# Patient Record
Sex: Female | Born: 1960 | Race: White | Hispanic: No | Marital: Married | Smoking: Never smoker
Health system: Southern US, Community
[De-identification: ages and names within clinical notes are randomized; demographics above are authoritative.]

## PROBLEM LIST (undated history)

## (undated) DIAGNOSIS — I10 Essential (primary) hypertension: Secondary | ICD-10-CM

## (undated) DIAGNOSIS — I2510 Atherosclerotic heart disease of native coronary artery without angina pectoris: Secondary | ICD-10-CM

## (undated) DIAGNOSIS — E785 Hyperlipidemia, unspecified: Secondary | ICD-10-CM

## (undated) HISTORY — PX: NEUROMA SURGERY: SHX722

## (undated) HISTORY — DX: Hyperlipidemia, unspecified: E78.5

## (undated) HISTORY — PX: ABDOMINAL HYSTERECTOMY: SHX81

## (undated) HISTORY — PX: CORONARY ARTERY BYPASS GRAFT: SHX141

## (undated) HISTORY — PX: CARDIAC CATHETERIZATION: SHX172

## (undated) HISTORY — DX: Atherosclerotic heart disease of native coronary artery without angina pectoris: I25.10

## (undated) HISTORY — DX: Essential (primary) hypertension: I10

---

## 2013-01-24 HISTORY — PX: OTHER SURGICAL HISTORY: SHX169

## 2013-02-04 ENCOUNTER — Observation Stay: Payer: Self-pay | Admitting: Internal Medicine

## 2013-02-04 LAB — TROPONIN I: Troponin-I: 0.1 ng/mL — ABNORMAL HIGH

## 2013-02-04 LAB — CK TOTAL AND CKMB (NOT AT ARMC): CK, Total: 50 U/L (ref 21–215)

## 2013-02-04 LAB — CBC
HCT: 41 % (ref 35.0–47.0)
MCHC: 33.8 g/dL (ref 32.0–36.0)
MCV: 85 fL (ref 80–100)
RDW: 12.9 % (ref 11.5–14.5)
WBC: 8.1 10*3/uL (ref 3.6–11.0)

## 2013-02-04 LAB — BASIC METABOLIC PANEL
Calcium, Total: 8.9 mg/dL (ref 8.5–10.1)
Chloride: 114 mmol/L — ABNORMAL HIGH (ref 98–107)
Co2: 22 mmol/L (ref 21–32)
EGFR (African American): >60
Glucose: 110 mg/dL — ABNORMAL HIGH (ref 65–99)
Sodium: 142 mmol/L (ref 136–145)

## 2013-02-05 LAB — BASIC METABOLIC PANEL
BUN: 12 mg/dL (ref 7–18)
Calcium, Total: 8.5 mg/dL (ref 8.5–10.1)
Chloride: 110 mmol/L — ABNORMAL HIGH (ref 98–107)
Co2: 26 mmol/L (ref 21–32)
Creatinine: 0.64 mg/dL (ref 0.60–1.30)
EGFR (African American): >60
EGFR (Non-African Amer.): >60
Sodium: 142 mmol/L (ref 136–145)

## 2013-02-05 LAB — LIPID PANEL: Cholesterol: 182 mg/dL (ref 0–200)

## 2013-02-05 LAB — CBC WITH DIFFERENTIAL/PLATELET
Basophil %: 0.5 %
Eosinophil #: 0.2 10*3/uL (ref 0.0–0.7)
HCT: 40.3 % (ref 35.0–47.0)
HGB: 13.7 g/dL (ref 12.0–16.0)
MCH: 28.8 pg (ref 26.0–34.0)
MCV: 85 fL (ref 80–100)
Monocyte #: 0.5 x10 3/mm (ref 0.2–0.9)
Monocyte %: 6.2 %
Neutrophil #: 4 10*3/uL (ref 1.4–6.5)
Neutrophil %: 49.7 %
RDW: 12.7 % (ref 11.5–14.5)

## 2014-06-17 IMAGING — US ABDOMEN ULTRASOUND LIMITED
1 series · 14 of 25 positions shown · non-contrast
Comparison: none

REASON FOR EXAM: RUQ pain and nausea
COMMENTS:   Body Site: GB and Fossa, CBD, Head of Pancreas

PROCEDURE:     US  - US ABDOMEN LIMITED SURVEY  - February 04, 2013  [DATE]
RESULT:     Comparison: None.
TECHNIQUE: Multiple grayscale and color Doppler images were obtained of the
right upper quadrant.

[Series 1: abdomen ultrasound limited · 0.31mm/px · 14 of 45 slices shown]
[im 1/45]
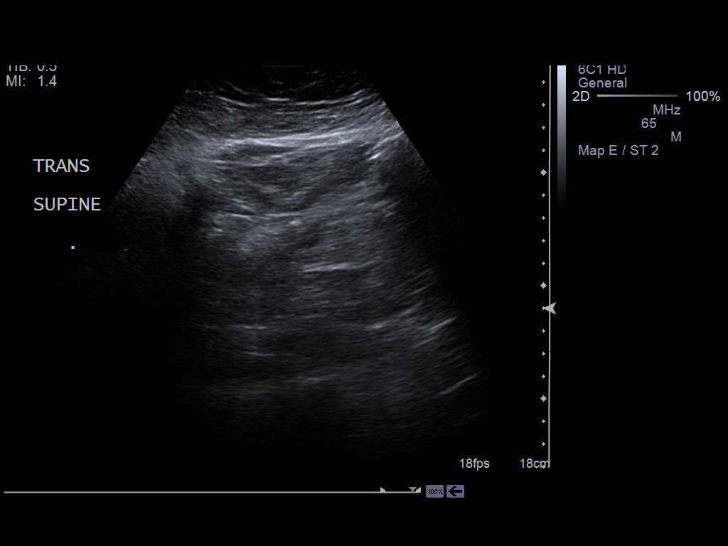
[im 4/45]
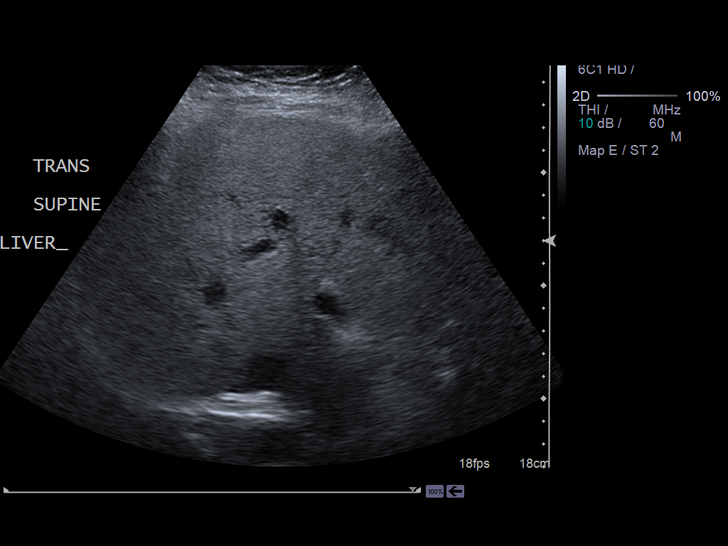
[im 8/45]
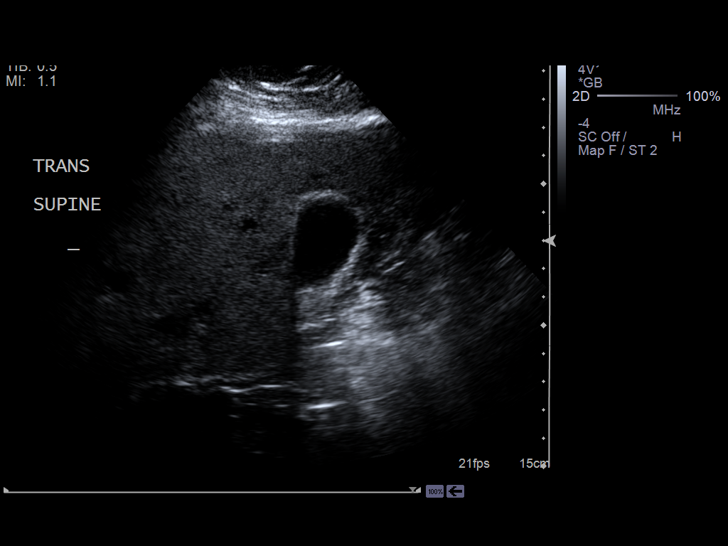
[im 12/45]
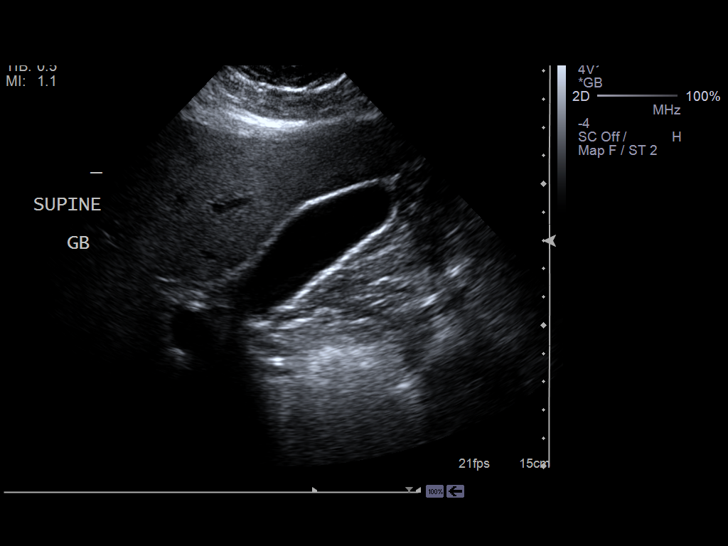
[im 15/45]
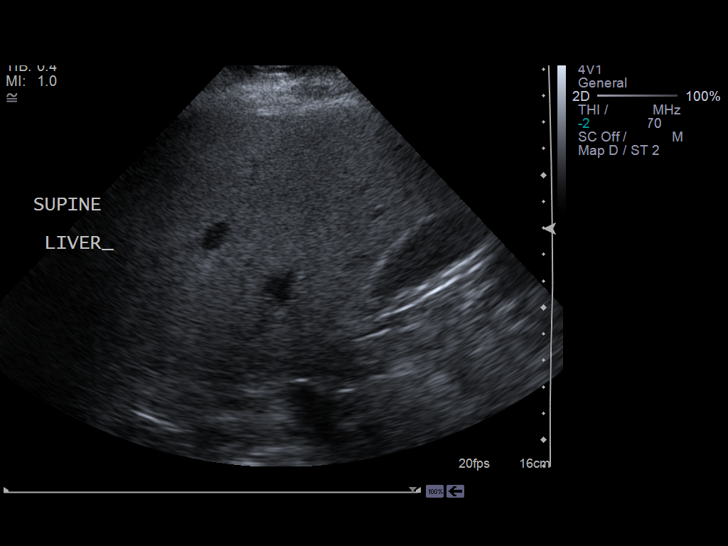
[im 17/45]
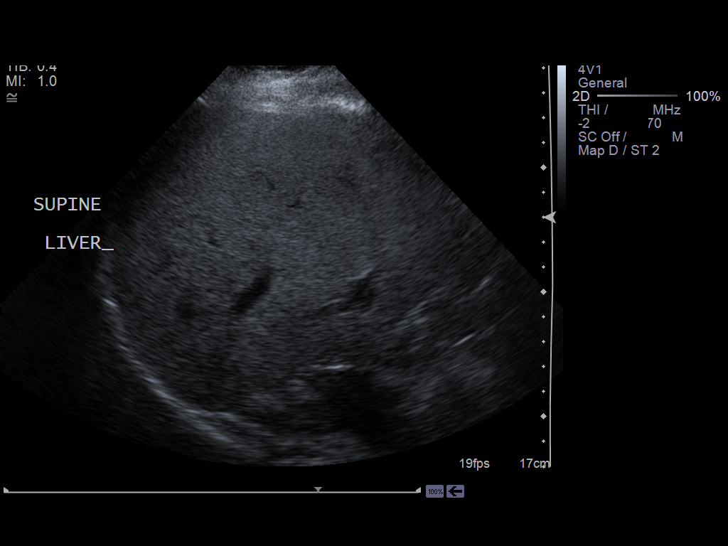
[im 21/45]
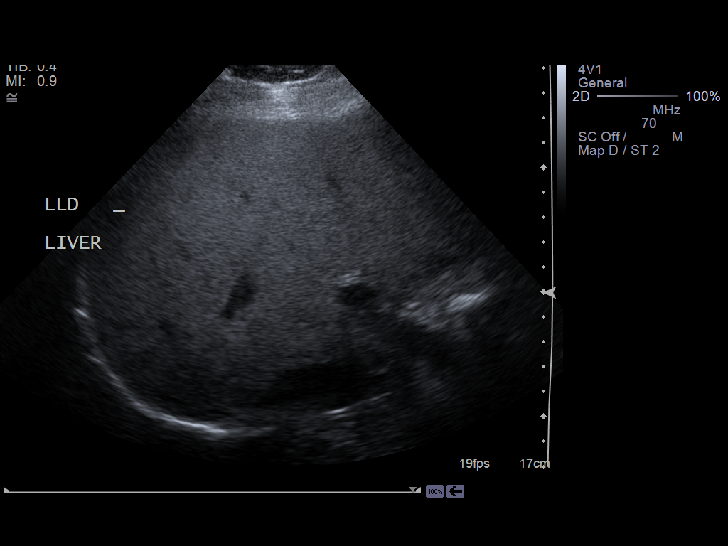
[im 24/45]
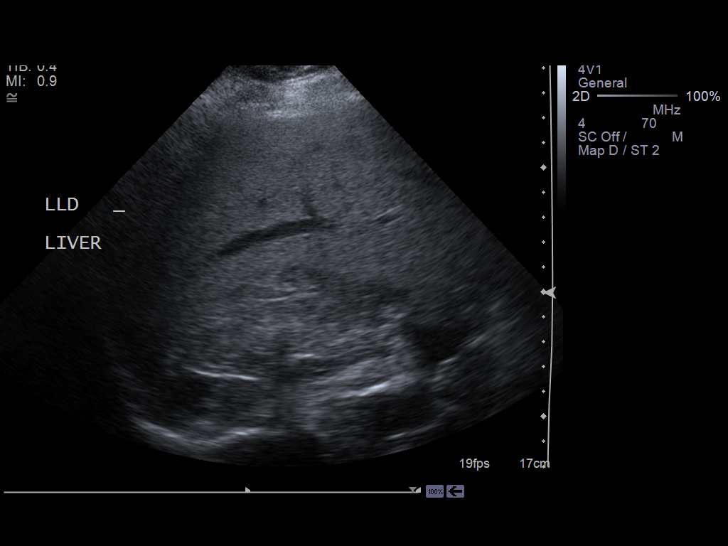
[im 28/45]
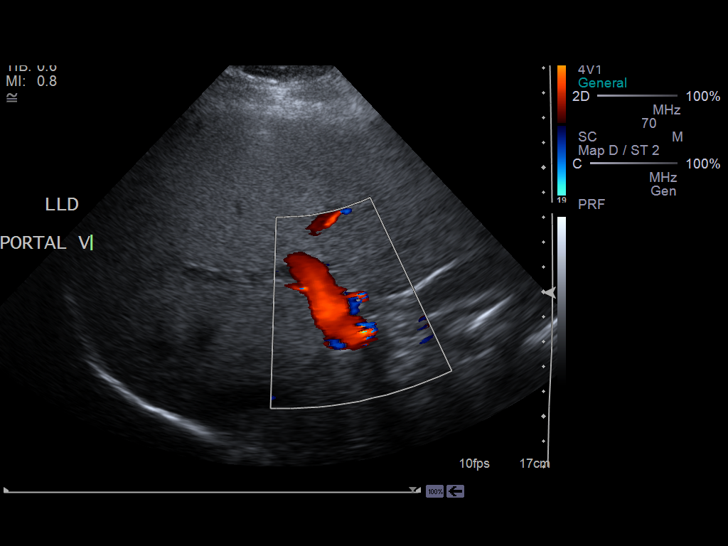
[im 30/45]
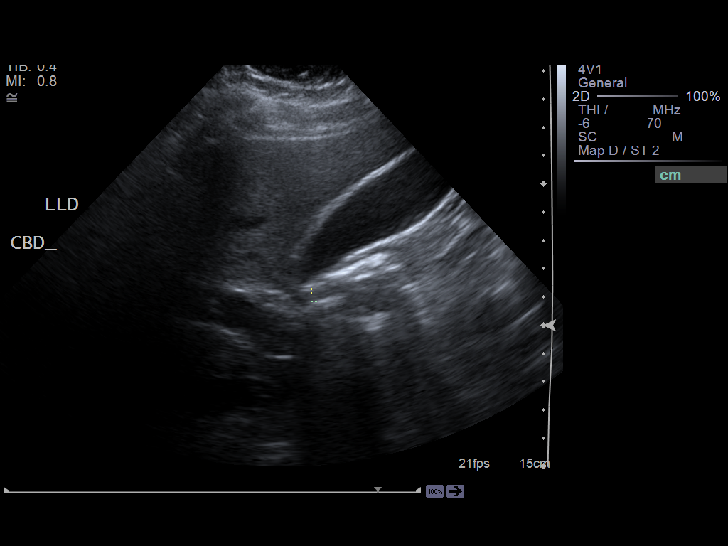
[im 34/45]
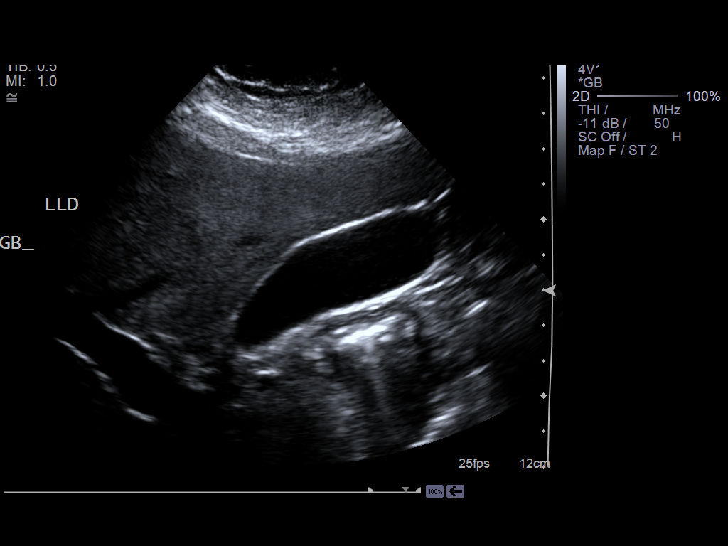
[im 37/45]
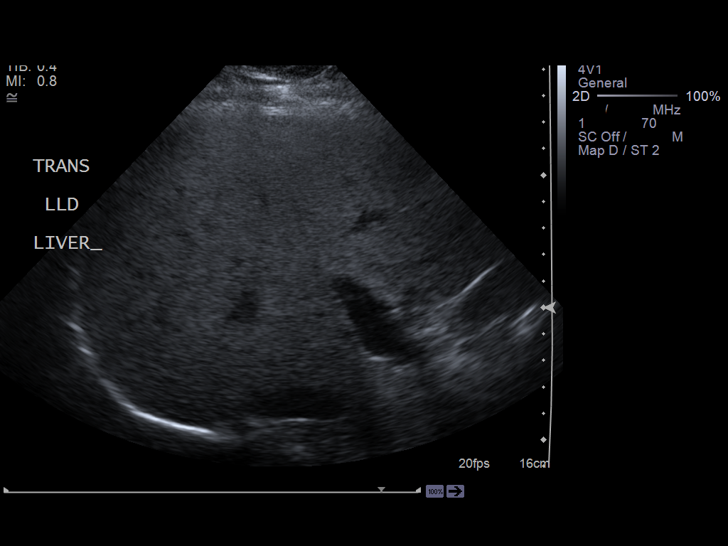
[im 41/45]
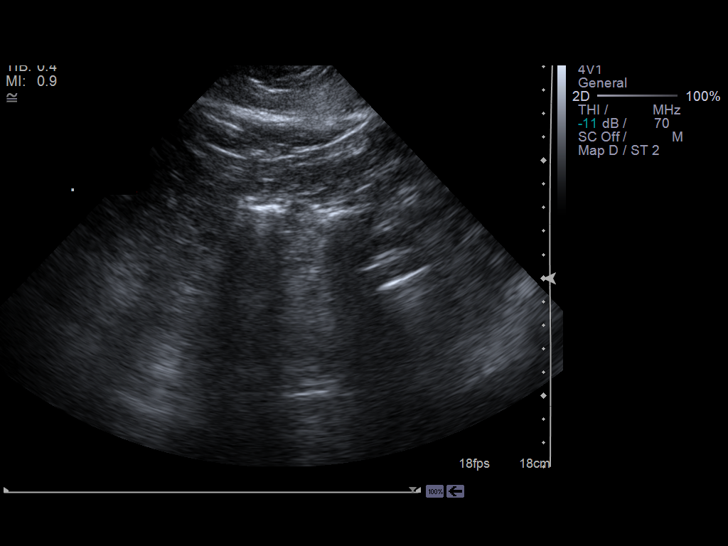
[im 45/45]
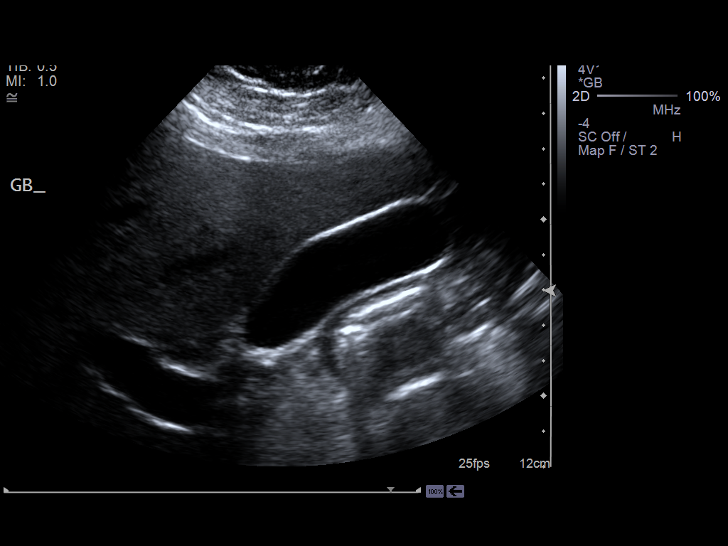

[14 of 25 positions shown; findings below may reference images not displayed]

FINDINGS: The pancreas was obscured by overlying bowel gas. The liver is somewhat
hyperechoic, raising the possibility of hepatic steatosis. The gallbladder
is normal. Sonographic Murphy sign was negative. The common bile duct
measures 4 mm in diameter.
IMPRESSION: 1. Normal gallbladder.
2. The pancreas was obscured.

## 2015-03-18 NOTE — Discharge Summary (Signed)
PATIENT NAME:  Tracie [name], Tracie [name] MR#:  [id number] DATE OF BIRTH:  13-Jul-1961  DATE OF ADMISSION:  02/04/2013 DATE OF DISCHARGE:  02/06/2013  DISCHARGE DIAGNOSES: 1.  Atypical chest/back pain with borderline elevated cardiac enzymes, likely noncardiac, could be musculoskeletal in nature, now resolved. Negative Myoview.  2.  Hyperlipidemia, diet controlled, LDL of 109.   SECONDARY DIAGNOSIS:  Arthritis.   CONSULTANT: Harold HedgeKenneth Fath, MD - Cardiology.  PROCEDURES/RADIOLOGY: Myoview on 14th of March showed negative Myoview, EF of 48%. No evidence of ischemia.   Chest x-ray on 12th of March showed no acute cardiopulmonary disease.   Abdominal ultrasound on 12th of March showed normal gallbladder, obscured pancreas.   2-[name] echocardiogram on 12th of March showed normal LV ejection fraction with a value of 60% to 65%   HISTORY AND SHORT HOSPITAL COURSE: The patient is a 54 year old female with no significant medical problems who was admitted for atypical chest/back pain. Cardiology consultation was obtained with Dr. Lady GaryFath as the patient had borderline elevated cardiac enzymes. He recommended Myoview, which was performed on 14th of March and was negative. She had chest pain and back pain which was resolved and was stable enough to be discharged home. On the 14th of March she was discharged home in stable condition. On the date of discharge, her vital signs were as follows: Temperature 98, heart rate 96 per minute, respirations 18 per minute, blood pressure 124/88 mmHg and she was saturating 97% on room air.   PERTINENT PHYSICAL EXAMINATION ON THE DATE OF DISCHARGE:  CARDIOVASCULAR: S1 and S2 normal. No murmurs, rubs or gallop.  LUNGS: Clear to auscultation bilaterally. No wheezing, rales, rhonchi or crepitation.  ABDOMEN: Soft, benign.  NEUROLOGIC: Nonfocal examination. All other physical examination remained at baseline.   DISCHARGE MEDICATION: Ibuprofen 600 mg p.o. every 8 hours as needed.   DISCHARGE  DIET: Regular.   DISCHARGE ACTIVITY: As tolerated.   DISCHARGE INSTRUCTIONS:  The patient was instructed to follow up with her primary care physician, Ms. Destry Taylor/Dr. Loraine LericheMark [facility]. She was also requested to follow up with cardiology, Dr. Lady GaryFath, in 2 to 4 weeks if needed.   TOTAL TIME DISCHARGING THIS PATIENT: 45 minutes.  ____________________________ Ellamae SiaVipul S. Sherryll BurgerShah, MD vss:sb [name]: 02/10/2013 11:20:01 ET T: 02/10/2013 11:40:45 ET JOB#: [id number]  cc: Cooper Stamp S. Sherryll BurgerShah, MD, <Dictator> [facility] Kenneth A. Lady GaryFath, MD  Patricia PesaVIPUL S Karron Goens MD ELECTRONICALLY SIGNED 02/12/2013 9:40

## 2015-03-18 NOTE — Consult Note (Signed)
Brief Consult Note: Diagnosis: chest pain with borderline troponin elvation.   Patient was seen by consultant.   Recommend further assessment or treatment.   Comments: Pt with history of strong family history of cvad now with chest pian with atypical features. Tropnin borderline elevated. EKG unremarkable. Stong family history of cad. After discussion with patient and husband, will schedule funcitonl study in am. Further recs after myoview. Based on ekg and tro[ponin, does not appear to be a nstemi..  Electronic Signatures: Dalia HeadingFath, Kenneth A (MD)  (Signed 13-Mar-14 16:18)  Authored: Brief Consult Note   Last Updated: 13-Mar-14 16:18 by Dalia HeadingFath, Kenneth A (MD)

## 2015-03-18 NOTE — H&P (Signed)
PATIENT NAME:  Tracie [name], Tracie [name] MR#:  [id number] DATE OF BIRTH:  September 21, 1961  DATE OF ADMISSION:  02/04/2013  PRIMARY CARE PHYSICIAN: Steele Sizer, MD, Family Practice. She follows with the PA there.   CHIEF COMPLAINT: Awoke out of bed out of a dead sleep with pain in the back between shoulder blades, radiating up into the neck/ear area and down to the elbows bilaterally, associated with chest tightness, heaviness in the chest, 9 out of 10 in intensity pain. The pain does come and go. She finds it hard to take a deep breath. Lasting 1 minute at a time. Lying flat sort of exacerbates the pain, and sitting up makes it feel okay. Associated with nausea, but no vomiting. No diarrhea. She did have some chills yesterday and some blurry vision. She received morphine and nausea medication in the ER and feels a little bit better. In the ER, she had an ultrasound of the abdomen that was negative, a chest x-ray that was negative. They did a second troponin, and it was borderline at 0.09, and hospitalist services were contacted for further evaluation.   PAST MEDICAL HISTORY: Arthritis in knees and obesity.   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   ALLERGIES: TO SULFA, WHICH CAUSES A RASH.   MEDICATIONS: Include a multivitamin daily and Osteo Bi-Flex.   SOCIAL HISTORY: No smoking. Rare alcohol. No drug use. Works as a Sales executive.  FAMILY HISTORY: Mother living, 25, has heart disease and end-stage COPD. Father died in his 21s of an MI, had his first MI in his 75s. Siblings are healthy cardiac-wise, do have some psychiatric issues. One does have rubella-related issues  REVIEW OF SYSTEMS:   CONSTITUTIONAL: Positive for chills. No fever, no sweats. Positive for weight loss, 13 pounds, trying to do so.  EYES: She does wear glasses and has some fuzzy vision.  EARS, NOSE, MOUTH AND THROAT: Positive for postnasal drip. Positive for  hoarse voice.  CARDIOVASCULAR: Positive for heaviness in the chest.  RESPIRATORY: Positive for shortness of breath. No coughing. No sputum. No hemoptysis.  GASTROINTESTINAL: Positive for nausea. No vomiting. Slight abdominal pain in the right upper quadrant. No diarrhea. No constipation. No bright red blood per rectum. No melena.  GENITOURINARY: No burning on urination or hematuria.  MUSCULOSKELETAL: Positive for knee pain and chest and back pain today.  INTEGUMENT: No rashes or eruptions.  NEUROLOGICAL: No fainting or blackouts.  PSYCHIATRIC: No anxiety or depression.  ENDOCRINE: No thyroid problems.  HEMATOLOGIC AND LYMPHATIC: No anemia. No easy bruising or bleeding.   PHYSICAL EXAMINATION:  VITAL SIGNS: Temperature 98.6, pulse 70, respirations 20, blood pressure 144/73, pulse oximetry 98% on room air.  GENERAL: No respiratory distress.  EYES: Conjunctivae and lids normal. Pupils equal, round and reactive to light. Extraocular muscles intact. No nystagmus.  EARS, NOSE, MOUTH AND THROAT: Tympanic membranes: No erythema. Nasal mucosa: No erythema. Throat: Slight erythema. No exudate seen. Lips and gums: No lesions.  NECK: No JVD. No bruits. No lymphadenopathy. No thyromegaly. No thyroid nodules palpated.  RESPIRATORY: Lungs clear to auscultation. No use of accessory muscles to breathe. No rhonchi, rales or wheeze heard.  CARDIOVASCULAR: S1, S2 normal. Positive 2/6 systolic ejection murmur. Carotid upstroke 2+ bilaterally. No bruits. Dorsalis pedis pulses 2+ bilaterally. All pulses equal throughout upper and lower extremities.  CHEST WALL: Slight pain to palpation, left parasternal.  ABDOMEN: Soft, nontender. No organo- or splenomegaly. Normoactive bowel sounds. No masses felt.  LYMPHATIC: No lymph nodes in the neck.  MUSCULOSKELETAL: No clubbing, edema or cyanosis. The patient's pain is located parasternal between the shoulder blades. No pain to palpation over thoracic spine.  SKIN: No rashes  or lesions seen.  NEUROLOGICAL: Cranial nerves II through XII grossly intact. Deep tendon reflexes 2+, bilateral lower extremities.  PSYCHIATRIC: The patient is oriented to person, place and time   LABORATORY AND RADIOLOGICAL DATA: Chest x-ray negative. Glucose 110, BUN 11, creatinine 0.65, sodium 142, potassium 3.9, chloride 114, CO2 22, calcium 8.9. White blood cell count 8.1, H and H 13.9 and 41.0, platelet count of 197. Troponin first negative. Ultrasound of the abdomen showed normal gallbladder, pancreas obscured, fatty liver. Next troponin borderline at 0.09. EKG: Normal sinus rhythm, 80 beats per minute. No acute ST-T wave changes.   ASSESSMENT AND PLAN:  1. Chest pain and back pain with borderline elevated troponin. Unclear at this point what the etiology is. Could be musculoskeletal with reproducible chest pain, could be cardiac with a borderline troponin, could be a pericarditis with positional-type pain. Less likely pulmonary embolism since the symptoms come and go. Will get serial troponins admit as an observation on telemetry. Continue aspirin on a daily basis, p.r.n. Motrin. Will obtain an echocardiogram and obtain a stress test tomorrow morning if the heart enzymes just remain borderline. This is less likely aortic dissection with all pulses equal throughout.  2. Fatty liver on ultrasound, most likely weight related. The patient is trying to lose weight.  3. Obesity, with BMI 37.8. The patient is trying to lose weight.   TIME SPENT ON OBSERVATION ADMISSION: 55 minutes.   ____________________________ Herschell Dimesichard J. Renae GlossWieting, MD rjw:OSi [name]: 02/04/2013 10:47:14 ET T: 02/04/2013 11:08:42 ET JOB#: [id number]  cc: Herschell Dimesichard J. Renae GlossWieting, MD, <Dictator> Steele SizerMark A. [facility], MD Salley ScarletICHARD J WIETING MD ELECTRONICALLY SIGNED 02/05/2013 19:04

## 2015-12-09 ENCOUNTER — Ambulatory Visit (INDEPENDENT_AMBULATORY_CARE_PROVIDER_SITE_OTHER): Payer: BLUE CROSS/BLUE SHIELD | Admitting: Family Medicine

## 2015-12-09 ENCOUNTER — Encounter: Payer: Self-pay | Admitting: Family Medicine

## 2015-12-09 VITALS — BP 108/74 | HR 82 | Temp 98.7°F | Ht 64.0 in | Wt 224.0 lb

## 2015-12-09 DIAGNOSIS — I2510 Atherosclerotic heart disease of native coronary artery without angina pectoris: Secondary | ICD-10-CM | POA: Insufficient documentation

## 2015-12-09 DIAGNOSIS — I10 Essential (primary) hypertension: Secondary | ICD-10-CM | POA: Insufficient documentation

## 2015-12-09 DIAGNOSIS — H66002 Acute suppurative otitis media without spontaneous rupture of ear drum, left ear: Secondary | ICD-10-CM | POA: Diagnosis not present

## 2015-12-09 DIAGNOSIS — E785 Hyperlipidemia, unspecified: Secondary | ICD-10-CM | POA: Insufficient documentation

## 2015-12-09 MED ORDER — HYDROCOD POLST-CPM POLST ER 10-8 MG/5ML PO SUER: 5.0000 mL | Freq: Every evening | ORAL | Status: AC | PRN

## 2015-12-09 MED ORDER — BENZONATATE 200 MG PO CAPS: 200.0000 mg | ORAL_CAPSULE | Freq: Three times a day (TID) | ORAL | Status: AC | PRN

## 2015-12-09 MED ORDER — AMOXICILLIN-POT CLAVULANATE 875-125 MG PO TABS: 1.0000 | ORAL_TABLET | Freq: Two times a day (BID) | ORAL | Status: AC

## 2015-12-09 NOTE — Progress Notes (Signed)
BP 108/74 mmHg  Pulse 82  Temp(Src) 98.7 F (37.1 C)  Ht _0  (1.626 m)  Wt 224 lb (101.606 kg)  BMI 38.43 kg/m2  SpO2 96%   Subjective:    Patient ID: Tracie [name], female    DOB: [birth year], 55 y.o.   MRN: [id number]  HPI: Tracie [name] is a 54 y.o. female  Chief Complaint  Patient presents with  . URI    Patient states that she has been sick since January 1st.   UPPER RESPIRATORY TRACT INFECTION Duration: 2 weeks  Worst symptom: Clogged ears bilaterally Fever: yes Cough: yes Shortness of breath: yes Wheezing: no Chest pain: no Chest tightness: no Chest congestion: no Nasal congestion: yes Runny nose: yes Post nasal drip: yes Sneezing: yes Sore throat: yes Swollen glands: no Sinus pressure: no Headache: yes Face pain: no Toothache: no Ear pain: yes left Ear pressure: yes bilateral Eyes red/itching:no Eye drainage/crusting: yes  Vomiting: no Rash: no Fatigue: yes Sick contacts: yes Strep contacts: no  Context: stable Recurrent sinusitis: no Relief with OTC cold/cough medications: no  Treatments attempted: nasal saline and pseudoephedrine delsym  Relevant past medical, surgical, family and social history reviewed and updated as indicated. Interim medical history since our last visit reviewed. Allergies and medications reviewed and updated.  Review of Systems  Constitutional: Negative.   HENT: Positive for congestion, hearing loss, postnasal drip, rhinorrhea, sinus pressure, sneezing and sore throat. Negative for dental problem, drooling, ear discharge, ear pain, facial swelling, mouth sores, nosebleeds, tinnitus, trouble swallowing and voice change.   Respiratory: Positive for cough and shortness of breath. Negative for apnea, chest tightness, wheezing and stridor.   Cardiovascular: Negative.   Psychiatric/Behavioral: Negative.     Per HPI unless specifically indicated above     Objective:    BP 108/74 mmHg  Pulse 82  Temp(Src) 98.7 F (37.1  C)  Ht _1  (1.626 m)  Wt 224 lb (101.606 kg)  BMI 38.43 kg/m2  SpO2 96%  Wt Readings from Last 3 Encounters:  12/09/15 224 lb (101.606 kg)  01/11/14 208 lb (94.348 kg)    Physical Exam  Constitutional: She is oriented to person, place, and time. She appears well-developed and well-nourished. No distress.  HENT:  Head: Normocephalic and atraumatic.  Right Ear: Hearing, tympanic membrane, external ear and ear canal normal.  Left Ear: Hearing and ear canal normal. There is tenderness. Tympanic membrane is erythematous and bulging. A middle ear effusion is present.  Nose: Rhinorrhea and sinus tenderness present. No mucosal edema, nose lacerations, nasal deformity, septal deviation or nasal septal hematoma. No epistaxis.  No foreign bodies. Right sinus exhibits no maxillary sinus tenderness and no frontal sinus tenderness. Left sinus exhibits no maxillary sinus tenderness and no frontal sinus tenderness.  Mouth/Throat: Uvula is midline, oropharynx is clear and moist and mucous membranes are normal. No oropharyngeal exudate.  Eyes: Conjunctivae, EOM and lids are normal. Pupils are equal, round, and reactive to light. Right eye exhibits no discharge. Left eye exhibits no discharge. No scleral icterus.  Neck: Normal range of motion. Neck supple. No JVD present. No tracheal deviation present. No thyromegaly present.  Cardiovascular: Normal rate, regular rhythm, normal heart sounds and intact distal pulses.  Exam reveals no gallop and no friction rub.   No murmur heard. Pulmonary/Chest: Effort normal and breath sounds normal. No stridor. No respiratory distress. She has no wheezes. She has no rales. She exhibits no tenderness.  Musculoskeletal: Normal range of motion.  Lymphadenopathy:  She has cervical adenopathy.  Neurological: She is alert and oriented to person, place, and time.  Skin: Skin is warm, dry and intact. No rash noted. She is not diaphoretic. No erythema. No pallor.  Psychiatric:  She has a normal mood and affect. Her speech is normal and behavior is normal. Judgment and thought content normal. Cognition and memory are normal.  Nursing note and vitals reviewed.   Results for orders placed or performed in visit on 02/04/13  Troponin I  Result Value Ref Range   Troponin-I < 0.02 ng/mL  CK total and CKMB (cardiac)  Result Value Ref Range   CK, Total 50 21-215 Unit/L   CK-MB < 0.5 (L) 0.5-3.6 ng/mL  CBC  Result Value Ref Range   WBC 8.1 3.6-11.0 x10 3/mm 3   RBC 4.84 3.80-5.20 X10 6/mm 3   HGB 13.9 12.0-16.0 g/dL   HCT 41.0 35.0-47.0 %   MCV 85 80-100 fL   MCH 28.6 26.0-34.0 pg   MCHC 33.8 32.0-36.0 g/dL   RDW 12.9 11.5-14.5 %   Platelet 197 150-440 x10 3/mm 3  Basic metabolic panel  Result Value Ref Range   Glucose 110 (H) 65-99 mg/dL   BUN 11 7-18 mg/dL   Creatinine 0.65 0.60-1.30 mg/dL   Sodium 142 136-145 mmol/L   Potassium 3.9 3.5-5.1 mmol/L   Chloride 114 (H) 98-107 mmol/L   Co2 22 21-32 mmol/L   Calcium, Total 8.9 8.5-10.1 mg/dL   Osmolality 283 275-301   Anion Gap 6 (L) 7-16   EGFR (African American) >60    EGFR (Non-African Amer.) >60   Troponin I  Result Value Ref Range   Troponin-I 0.09 (H) ng/mL  Troponin I  Result Value Ref Range   Troponin-I 0.10 (H) ng/mL  CBC with Differential/Platelet  Result Value Ref Range   WBC 8.1 3.6-11.0 x10 3/mm 3   RBC 4.74 3.80-5.20 X10 6/mm 3   HGB 13.7 12.0-16.0 g/dL   HCT 40.3 35.0-47.0 %   MCV 85 80-100 fL   MCH 28.8 26.0-34.0 pg   MCHC 33.9 32.0-36.0 g/dL   RDW 12.7 11.5-14.5 %   Platelet 195 150-440 x10 3/mm 3   Neutrophil % 49.7 %   Lymphocyte % 41.4 %   Monocyte % 6.2 %   Eosinophil % 2.2 %   Basophil % 0.5 %   Neutrophil # 4.0 1.4-6.5 x10 3/mm 3   Lymphocyte # 3.4 1.0-3.6 x10 3/mm 3   Monocyte # 0.5 0.2-0.9 x10 3/mm    Eosinophil # 0.2 0.0-0.7 x10 3/mm 3   Basophil # 0.0 0.0-0.1 x10 3/mm 3  Basic metabolic panel  Result Value Ref Range   Glucose 90 65-99 mg/dL   BUN 12 7-18 mg/dL    Creatinine 0.64 0.60-1.30 mg/dL   Sodium 142 136-145 mmol/L   Potassium 3.8 3.5-5.1 mmol/L   Chloride 110 (H) 98-107 mmol/L   Co2 26 21-32 mmol/L   Calcium, Total 8.5 8.5-10.1 mg/dL   Osmolality 282 275-301   Anion Gap 6 (L) 7-16   EGFR (African American) >60    EGFR (Non-African Amer.) >60   Lipid panel  Result Value Ref Range   Cholesterol 182 0-200 mg/dL   Triglycerides 216 (H) 0-200 mg/dL   HDL Cholesterol 30 (L) 40-60 mg/dL   VLDL Cholesterol, Calc 43 (H) 5-40 mg/dL   Ldl Cholesterol, Calc 109 (H) 0-100 mg/dL  Troponin I  Result Value Ref Range   Troponin-I 0.09 (H) ng/mL      Assessment &  Plan:   Problem List Items Addressed This Visit    None    Visit Diagnoses    Acute suppurative otitis media of left ear without spontaneous rupture of tympanic membrane, recurrence not specified    -  Primary    Will treat with aumentin. Treat cough with tussionex and tessalon perles for comfort. Call if not getting better or getting worse.     Relevant Medications    amoxicillin-clavulanate (AUGMENTIN) 875-125 MG tablet        Follow up plan: Return if symptoms worsen or fail to improve.

## 2015-12-16 ENCOUNTER — Telehealth: Payer: Self-pay | Admitting: Family Medicine

## 2015-12-16 NOTE — Telephone Encounter (Signed)
Forward to provider

## 2015-12-16 NOTE — Telephone Encounter (Signed)
She should finish her antibiotics. If she's still sick on Monday, let me know.

## 2015-12-16 NOTE — Telephone Encounter (Signed)
pts ears are still stopped up and she would like to know what to do.

## 2015-12-16 NOTE — Telephone Encounter (Signed)
Patient notified

## 2019-07-23 ENCOUNTER — Other Ambulatory Visit: Payer: Self-pay

## 2019-07-23 DIAGNOSIS — Z20822 Contact with and (suspected) exposure to covid-19: Secondary | ICD-10-CM

## 2019-07-24 LAB — NOVEL CORONAVIRUS, NAA: SARS-CoV-2, NAA: NOT DETECTED

## 2020-06-24 ENCOUNTER — Ambulatory Visit: Payer: BLUE CROSS/BLUE SHIELD | Attending: Internal Medicine

## 2020-06-24 DIAGNOSIS — Z20822 Contact with and (suspected) exposure to covid-19: Secondary | ICD-10-CM | POA: Insufficient documentation

## 2020-06-25 LAB — NOVEL CORONAVIRUS, NAA: SARS-CoV-2, NAA: NOT DETECTED

## 2020-06-25 LAB — SARS-COV-2, NAA 2 DAY TAT

## 2020-11-29 ENCOUNTER — Other Ambulatory Visit: Payer: BLUE CROSS/BLUE SHIELD

## 2020-11-29 DIAGNOSIS — Z20822 Contact with and (suspected) exposure to covid-19: Secondary | ICD-10-CM

## 2020-11-30 LAB — NOVEL CORONAVIRUS, NAA: SARS-CoV-2, NAA: DETECTED — AB

## 2020-11-30 LAB — SARS-COV-2, NAA 2 DAY TAT

## 2020-11-30 LAB — SPECIMEN STATUS REPORT
